# Patient Record
Sex: Female | Born: 1960 | Race: Black or African American | Hispanic: No | Smoking: Former smoker
Health system: Southern US, Community
[De-identification: ages and names within clinical notes are randomized; demographics above are authoritative.]

## PROBLEM LIST (undated history)

## (undated) HISTORY — PX: TUBAL LIGATION: SHX77

## (undated) HISTORY — PX: PARTIAL HYSTERECTOMY: SHX80

---

## 2019-07-18 ENCOUNTER — Ambulatory Visit: Payer: Self-pay | Attending: Internal Medicine

## 2019-07-18 DIAGNOSIS — Z23 Encounter for immunization: Secondary | ICD-10-CM

## 2019-07-18 NOTE — Progress Notes (Signed)
   Covid-19 Vaccination Clinic  Name:  Nicole Schultz    MRN: [id number] DOB: May 08, 1960  07/18/2019  [name] was observed post Covid-19 immunization for 15 minutes without incident. She was provided with Vaccine Information Sheet and instruction to access the V-Safe system.   [name] was instructed to call 911 with any severe reactions post vaccine: Marland Kitchen Difficulty breathing  . Swelling of face and throat  . A fast heartbeat  . A bad rash all over body  . Dizziness and weakness   Immunizations Administered    Name Date Dose VIS Date Route   Pfizer COVID-19 Vaccine 07/18/2019 10:26 AM 0.3 mL 04/10/2019 Intramuscular   Manufacturer: [employer], [employer]   Lot: [id number]   NDC: [address]-4

## 2019-08-11 ENCOUNTER — Ambulatory Visit: Payer: Self-pay | Attending: Internal Medicine

## 2019-08-11 DIAGNOSIS — Z23 Encounter for immunization: Secondary | ICD-10-CM

## 2019-08-11 NOTE — Progress Notes (Signed)
   Covid-19 Vaccination Clinic  Name:  Nicole Schultz    MRN: [id number] DOB: Sep 09, 1960  08/11/2019  Nicole Schultz was observed post Covid-19 immunization for 15 minutes without incident. She was provided with Vaccine Information Sheet and instruction to access the V-Safe system.   Nicole Schultz was instructed to call 911 with any severe reactions post vaccine: Marland Kitchen Difficulty breathing  . Swelling of face and throat  . A fast heartbeat  . A bad rash all over body  . Dizziness and weakness   Immunizations Administered    Name Date Dose VIS Date Route   Pfizer COVID-19 Vaccine 08/11/2019  1:17 PM 0.3 mL 04/10/2019 Intramuscular   Manufacturer: [employer], [employer]   Lot: [id number]   NDC: [address]-9

## 2019-12-29 ENCOUNTER — Ambulatory Visit
Admission: EM | Admit: 2019-12-29 | Discharge: 2019-12-29 | Disposition: A | Discharge status: Home / Self Care | Payer: BC Managed Care – PPO | Attending: Emergency Medicine | Admitting: Emergency Medicine

## 2019-12-29 ENCOUNTER — Other Ambulatory Visit: Payer: Self-pay

## 2019-12-29 DIAGNOSIS — Z20822 Contact with and (suspected) exposure to covid-19: Secondary | ICD-10-CM | POA: Diagnosis not present

## 2019-12-29 NOTE — Discharge Instructions (Signed)

## 2019-12-29 NOTE — ED Triage Notes (Signed)
Patient in today after having a covid exposure 1 week ago. Patient denies any symptoms. Patient here for covid testing only.

## 2019-12-30 LAB — SARS CORONAVIRUS 2 (TAT 6-24 HRS): SARS Coronavirus 2: NEGATIVE

## 2020-04-06 ENCOUNTER — Other Ambulatory Visit: Payer: Self-pay

## 2020-04-06 ENCOUNTER — Ambulatory Visit
Admission: EM | Admit: 2020-04-06 | Discharge: 2020-04-06 | Disposition: A | Discharge status: Home / Self Care | Payer: BC Managed Care – PPO | Attending: Family Medicine | Admitting: Family Medicine

## 2020-04-06 DIAGNOSIS — Z20822 Contact with and (suspected) exposure to covid-19: Secondary | ICD-10-CM | POA: Diagnosis present

## 2020-04-06 NOTE — ED Triage Notes (Signed)
Patient here for COVID testing. Positive exposure. No symptoms.

## 2020-04-07 LAB — SARS CORONAVIRUS 2 (TAT 6-24 HRS): SARS Coronavirus 2: NEGATIVE

## 2020-04-12 ENCOUNTER — Other Ambulatory Visit: Payer: Self-pay | Admitting: Neurology

## 2020-04-12 DIAGNOSIS — R2689 Other abnormalities of gait and mobility: Secondary | ICD-10-CM

## 2020-04-25 ENCOUNTER — Other Ambulatory Visit: Payer: BC Managed Care – PPO

## 2020-05-16 ENCOUNTER — Other Ambulatory Visit: Payer: BC Managed Care – PPO

## 2020-05-30 ENCOUNTER — Other Ambulatory Visit: Payer: BC Managed Care – PPO

## 2020-06-13 ENCOUNTER — Ambulatory Visit (INDEPENDENT_AMBULATORY_CARE_PROVIDER_SITE_OTHER): Payer: BC Managed Care – PPO

## 2020-06-13 ENCOUNTER — Other Ambulatory Visit: Payer: Self-pay

## 2020-06-13 DIAGNOSIS — R2689 Other abnormalities of gait and mobility: Secondary | ICD-10-CM

## 2020-06-13 DIAGNOSIS — M48061 Spinal stenosis, lumbar region without neurogenic claudication: Secondary | ICD-10-CM

## 2020-06-13 DIAGNOSIS — M4804 Spinal stenosis, thoracic region: Secondary | ICD-10-CM

## 2020-06-13 DIAGNOSIS — M4802 Spinal stenosis, cervical region: Secondary | ICD-10-CM | POA: Diagnosis not present

## 2020-06-13 MED ORDER — GADOBUTROL 1 MMOL/ML IV SOLN
10.0000 mL | Freq: Once | INTRAVENOUS | Status: AC | PRN
  Administered 2020-06-13: 10 mL via INTRAVENOUS

## 2021-09-18 IMAGING — MR MR THORACIC SPINE W/O CM
6 series · 37 of 48 positions shown · non-contrast
Comparison: None.

CLINICAL DATA: Imbalance for several years. Left hand and left leg
numbness. No known injury.

EXAM:
MRI CERVICAL, THORACIC AND LUMBAR SPINE WITHOUT CONTRAST
TECHNIQUE: Multiplanar and multiecho pulse sequences of the cervical spine, to
include the craniocervical junction and cervicothoracic junction,
and thoracic and lumbar spine, were obtained without intravenous
contrast.

[Series 15: T1 · sagittal · 7.0mm · 1.41mm/px · 2 of 7 slices shown (1 of 2)]
[im 1/7]
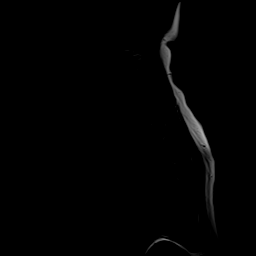
[im 7/7]
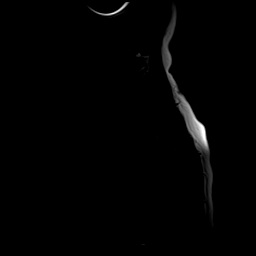

[Series 16: T2 · sagittal · 3.0mm · 1.00mm/px · 6 of 15 slices shown (1 of 2)]
[im 1/15]
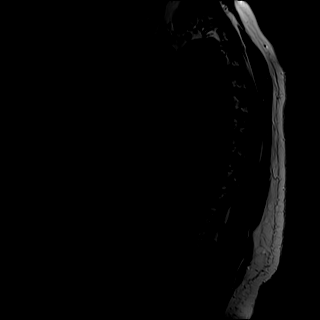
[im 3/15]
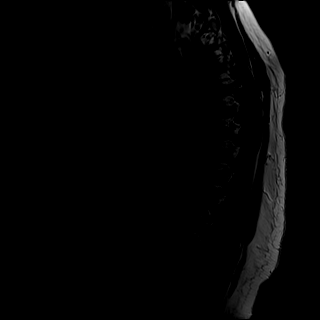
[im 6/15]
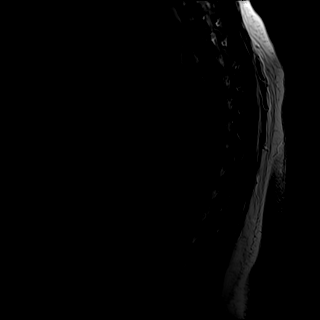
[im 9/15]
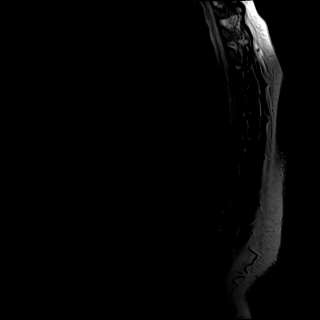
[im 12/15]
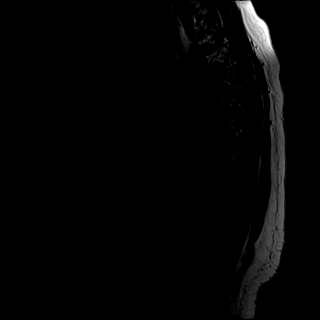
[im 15/15]
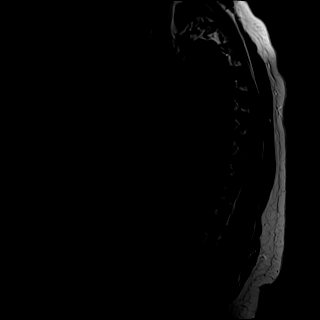

[Series 17: T1 · sagittal · 3.0mm · 1.00mm/px · 6 of 15 slices shown (2 of 2)]
[im 1/15]
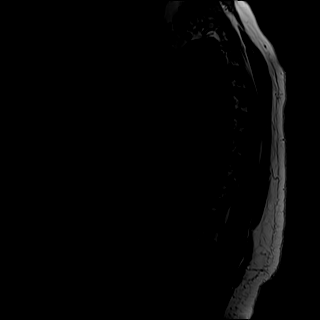
[im 3/15]
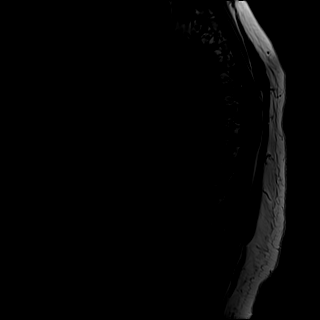
[im 6/15]
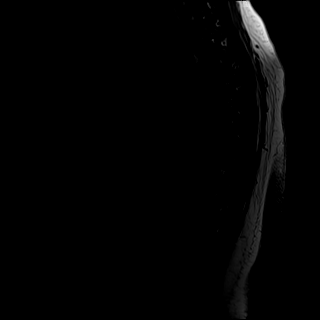
[im 9/15]
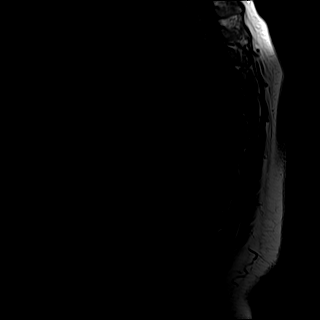
[im 12/15]
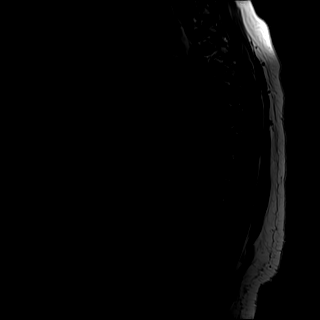
[im 15/15]
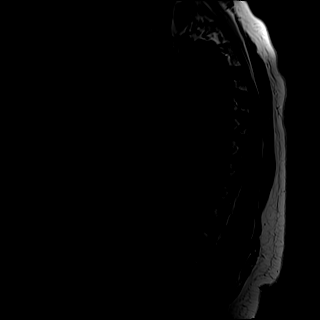

[Series 18: STIR · sagittal · 3.0mm · 1.00mm/px · 6 of 15 slices shown]
[im 1/15]
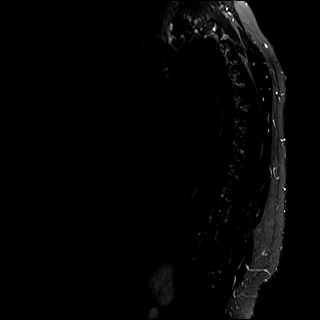
[im 3/15]
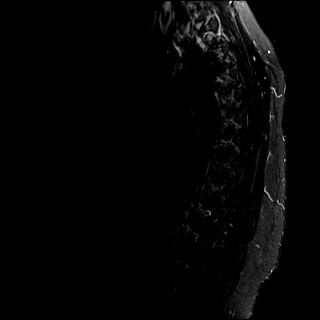
[im 6/15]
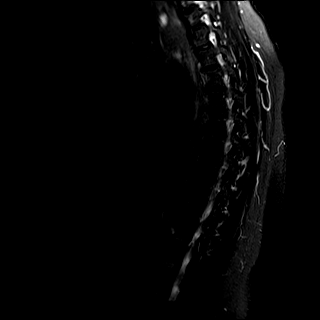
[im 9/15]
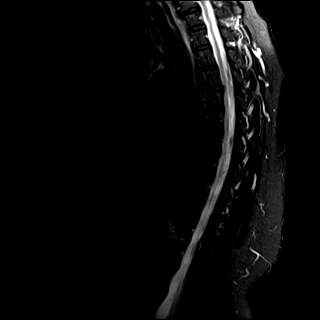
[im 12/15]
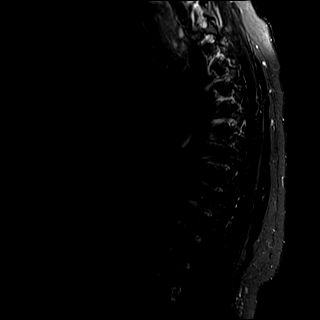
[im 15/15]
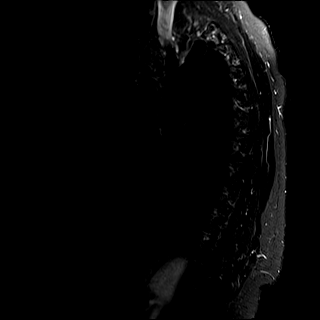

[Series 21: T2 · axial · 5.0mm · 0.86mm/px · z∈[-251,-39]mm · 9 of 39 slices shown (2 of 2)]
[im 1/39]
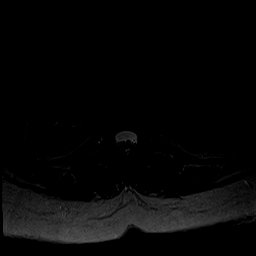
[im 3/39]
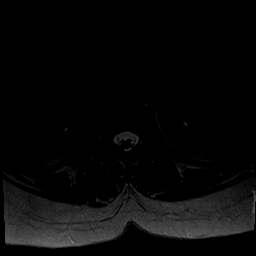
[im 6/39]
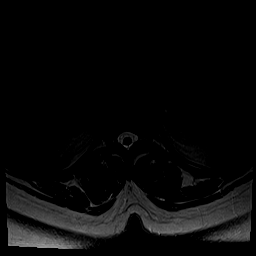
[im 12/39]
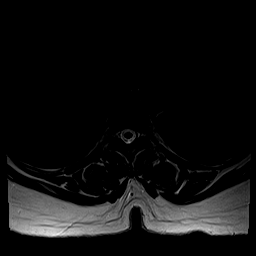
[im 18/39]
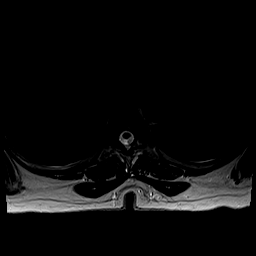
[im 21/39]
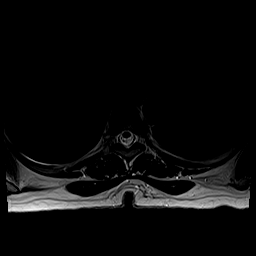
[im 27/39]
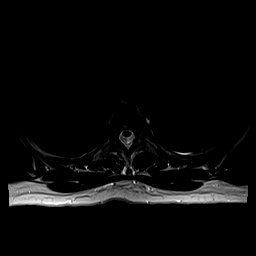
[im 33/39]
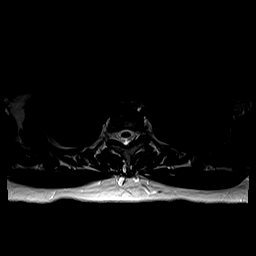
[im 39/39]
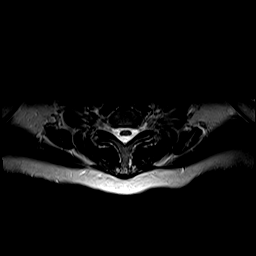

[Series 24: mpgr [id number]_11 · axial · 5.0mm · 0.35mm/px · z∈[-265,-27]mm · 8 of 39 slices shown]
[im 1/39]
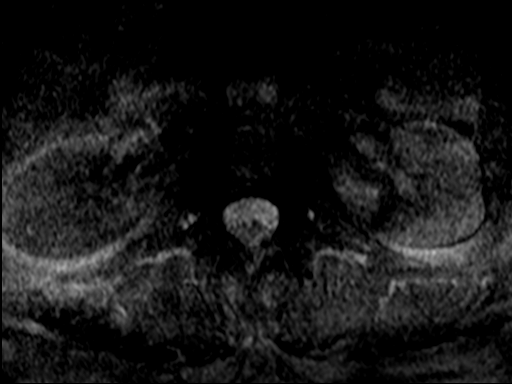
[im 6/39]
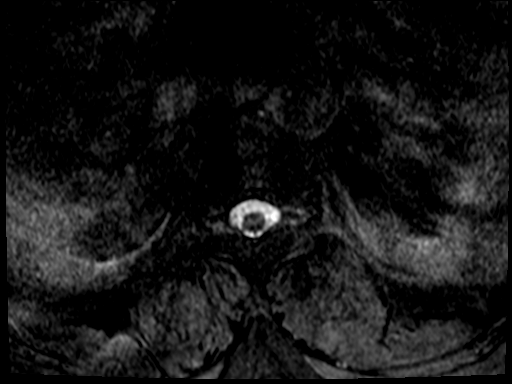
[im 12/39]
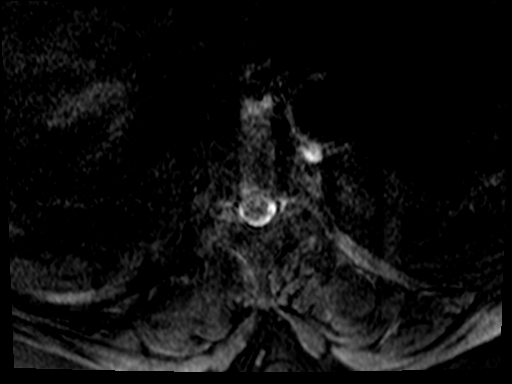
[im 18/39]
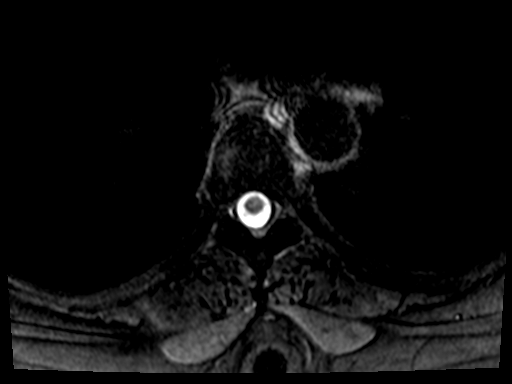
[im 21/39]
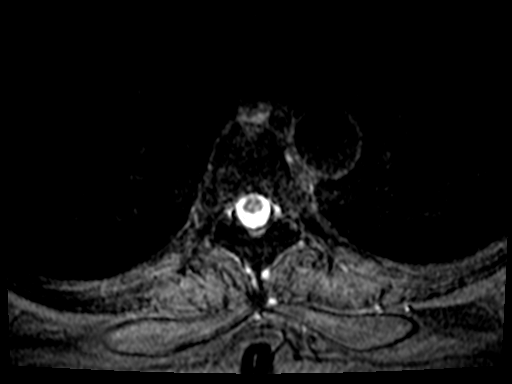
[im 27/39]
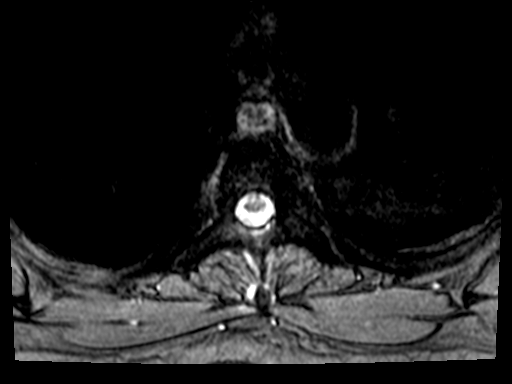
[im 33/39]
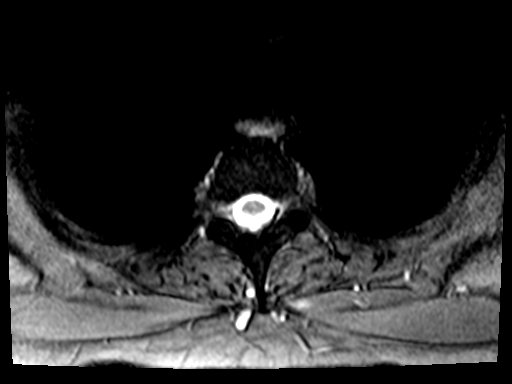
[im 39/39]
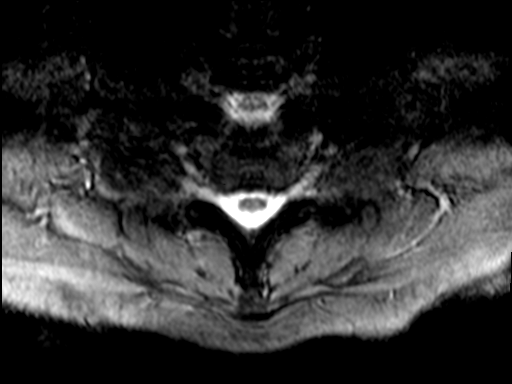

[37 of 48 positions shown; findings below may reference images not displayed]

FINDINGS: MRI CERVICAL SPINE FINDINGS

Alignment: Reversal of the normal cervical lordosis. No substantial
sagittal subluxation.

Vertebrae: Vertebral body heights are maintained. Degenerative
endplate signal changes about the C4-C5 disc without substantial
disc edema.

Cord: Normal cord signal.

Posterior Fossa, vertebral arteries, paraspinal tissues: Negative.

Disc levels:

C2-C3: No significant disc protrusion, foraminal stenosis, or canal
stenosis.

C3-C4: No significant disc protrusion, foraminal stenosis, or canal
stenosis.

C4-C5: Small posterior disc osteophyte complex with right greater
than left facet and uncovertebral hypertrophy. Mild right foraminal
stenosis. Disc contacts and mildly deforms the right eccentric cord
without significant canal stenosis.

C5-C6: Small posterior disc osteophyte complex and mild bilateral
uncovertebral hypertrophy without significant canal stenosis. Mild
right foraminal stenosis. Disc contacts and mildly flattens the
ventral cord without significant canal stenosis.

C6-C7: Small posterior disc osteophyte complex which mildly flattens
the ventral cord. Left greater than right uncovertebral hypertrophy
with mild left foraminal stenosis.

C7-T1: No significant disc protrusion, foraminal stenosis, or canal
stenosis.

MRI THORACIC SPINE FINDINGS

Alignment:  Normal.

Vertebrae: Vertebral body heights are maintained. Degenerative edema
involving the anterior/inferior T10 vertebral body.

Cord:  Normal cord signal and morphology.

Paraspinal and other soft tissues: Unremarkable.

Disc levels:

No significant canal stenosis. Degenerative changes result in mild
bilateral foraminal stenosis at T10-T11

MRI LUMBAR SPINE FINDINGS

Segmentation:  Standard.

Alignment:  No substantial sagittal subluxation.

Vertebrae: Vertebral body heights are maintained. Mild edema
involving the anterior/inferior L4 vertebral body.

Conus medullaris and cauda equina: Conus extends to the L1-L2 level.
Conus and cauda equina appear normal.

Paraspinal and other soft tissues: Bilateral extrarenal pelvis.

Disc levels:

T12-L1: No significant disc protrusion, foraminal stenosis, or canal
stenosis.

L1-L2: No significant disc protrusion, foraminal stenosis, or canal
stenosis.

L2-L3: No significant disc protrusion, foraminal stenosis, or canal
stenosis.

L3-L4: Disc desiccation. Broad-based disc bulge and mild bilateral
facet hypertrophy. Mild canal and bilateral foraminal stenosis

L4-L5: Disc desiccation. Mild broad disc bulge and mild bilateral
facet hypertrophy. Mild bilateral foraminal stenosis without
significant canal stenosis.

L5-S1: Mild disc bulging and mild bilateral facet hypertrophy
without significant canal or foraminal stenosis.
IMPRESSION: Cervical spine:

1. Focal degenerative endplate signal changes about the C4-C5 disc
with mild to moderate right foraminal stenosis at this level.
2. Otherwise, mild multilevel degenerative change with posterior
disc osteophyte complexes contacting and mildly flattening the
ventral cord at multiple levels without significant canal stenosis.
Mild foraminal stenosis on the right at C5-C6 and left at C6-C7.

Thoracic and lumbar spine:

1. Mild canal stenosis at L3-L4.
2. Mild foraminal stenosis bilaterally at T10-T11, L3-L4, and L4-L5.
3. Focal degenerative endplate signal changes involving the
anterior/inferior T10 and L4 vertebral bodies. While nonspecific and
potentially secondary to routine degenerative change, this finding
can be seen in the setting of early ankylosing spondylitis ( "shiny
corners"). Consider SI joint imaging to evaluate for sacroiliitis,
if clinically indicated.

## 2022-01-16 ENCOUNTER — Encounter: Payer: Self-pay | Admitting: Emergency Medicine

## 2022-01-16 ENCOUNTER — Ambulatory Visit
Admission: EM | Admit: 2022-01-16 | Discharge: 2022-01-16 | Disposition: A | Discharge status: Home / Self Care | Payer: BC Managed Care – PPO | Attending: Emergency Medicine | Admitting: Emergency Medicine

## 2022-01-16 DIAGNOSIS — J069 Acute upper respiratory infection, unspecified: Secondary | ICD-10-CM | POA: Diagnosis not present

## 2022-01-16 DIAGNOSIS — R059 Cough, unspecified: Secondary | ICD-10-CM | POA: Diagnosis present

## 2022-01-16 DIAGNOSIS — Z20822 Contact with and (suspected) exposure to covid-19: Secondary | ICD-10-CM | POA: Diagnosis not present

## 2022-01-16 DIAGNOSIS — J029 Acute pharyngitis, unspecified: Secondary | ICD-10-CM | POA: Insufficient documentation

## 2022-01-16 LAB — SARS CORONAVIRUS 2 BY RT PCR: SARS Coronavirus 2 by RT PCR: NEGATIVE

## 2022-01-16 NOTE — ED Triage Notes (Signed)
Pt presents with headache, runny nose, body aches since yesterday.

## 2022-01-16 NOTE — Discharge Instructions (Addendum)
Your COVID test today was negative.  I believe you have a viral respiratory infection.  Use over-the-counter Tylenol and ibuprofen according to the package instructions as needed for headache and body aches.  Use over-the-counter cough preparations such as Tylenol Cold and sinus, Advil Cold and Sinus, Robitussin, Delsym, or Zarbee's as needed for cough and congestion.  Return for reevaluation for any new or worsening symptoms.

## 2022-01-16 NOTE — ED Provider Notes (Signed)
[facility]    CSN: [id number] Arrival date & time: 01/16/22  [id number]      History   Chief Complaint Chief Complaint  Patient presents with   Headache   Nasal Congestion   Generalized Body Aches    HPI Nicole Schultz is a 61 y.o. female.   HPI  61 year old female here for evaluation of respiratory complaints.  Patient reports that her symptoms began yesterday and they consist of headache, body aches, runny nose, sore throat, and nonproductive cough.  She denies the presence of fever, ear pain, shortness breath or wheezing, or GI complaints.  She has no known sick contacts and she has not had any recent travel.  History reviewed. No pertinent past medical history.  There are no problems to display for this patient.   Past Surgical History:  Procedure Laterality Date   PARTIAL HYSTERECTOMY     TUBAL LIGATION      OB History   No obstetric history on file.      Home Medications    Prior to Admission medications   Medication Sig Start Date End Date Taking? Authorizing Provider  Black Pepper-Turmeric (TURMERIC COMPLEX/BLACK PEPPER) 3-500 MG CAPS  05/18/19  Yes [provider]  ascorbic acid (VITAMIN C) 100 MG tablet Take by mouth.    [provider]  Biotin 10 MG TABS Take by mouth.    [provider]  Cholecalciferol 125 MCG (5000 UT) TABS Take by mouth.    [provider]  cyanocobalamin (VITAMIN B12) 500 MCG tablet Take by mouth.    [provider]  loratadine-pseudoephedrine (CLARITIN-D 12-HOUR) 5-120 MG tablet Take by mouth.    [provider]  Multiple Vitamin (MULTIVITAMIN) tablet Take by mouth.    [provider]  valACYclovir (VALTREX) 1000 MG tablet TK 1 T PO ONCE DAILY    [provider]    Family History History reviewed. No pertinent family history.  Social History Social History   Tobacco Use   Smoking status: Former    Types: Cigarettes   Smokeless tobacco:  Never  Vaping Use   Vaping Use: Never used  Substance Use Topics   Alcohol use: Not Currently   Drug use: Never     Allergies   Penicillins   Review of Systems Review of Systems  Constitutional:  Negative for fever.  HENT:  Positive for congestion, rhinorrhea and sore throat. Negative for ear pain.   Respiratory:  Positive for cough. Negative for shortness of breath and wheezing.   Gastrointestinal:  Negative for diarrhea, nausea and vomiting.  Musculoskeletal:  Positive for arthralgias and myalgias.  Skin:  Negative for rash.  Neurological:  Positive for headaches.  Hematological: Negative.   Psychiatric/Behavioral: Negative.       Physical Exam Triage Vital Signs ED Triage Vitals  Enc Vitals Group     BP 01/16/22 0912 (!) 147/84     Pulse Rate 01/16/22 0912 70     Resp 01/16/22 0912 16     Temp 01/16/22 0912 98.3 F (36.8 C)     Temp Source 01/16/22 0912 Oral     SpO2 01/16/22 0912 100 %     Weight --      Height --      Head Circumference --      Peak Flow --      Pain Score 01/16/22 0905 4     Pain Loc --      Pain Edu? --  Excl. in GC? --    No data found.  Updated Vital Signs BP (!) 147/84 (BP Location: Left Arm)   Pulse 70   Temp 98.3 F (36.8 C) (Oral)   Resp 16   SpO2 100%   Visual Acuity Right Eye Distance:   Left Eye Distance:   Bilateral Distance:    Right Eye Near:   Left Eye Near:    Bilateral Near:     Physical Exam Vitals and nursing note reviewed.  Constitutional:      Appearance: Normal appearance. She is not ill-appearing.  HENT:     Head: Normocephalic and atraumatic.     Right Ear: Tympanic membrane, ear canal and external ear normal. There is no impacted cerumen.     Left Ear: Tympanic membrane, ear canal and external ear normal. There is no impacted cerumen.     Nose: Congestion and rhinorrhea present.     Mouth/Throat:     Mouth: Mucous membranes are moist.     Pharynx: Oropharynx is clear. Posterior  oropharyngeal erythema present. No oropharyngeal exudate.  Cardiovascular:     Rate and Rhythm: Normal rate and regular rhythm.     Pulses: Normal pulses.     Heart sounds: Normal heart sounds. No murmur heard.    No friction rub. No gallop.  Pulmonary:     Effort: Pulmonary effort is normal.     Breath sounds: Normal breath sounds. No wheezing, rhonchi or rales.  Musculoskeletal:     Cervical back: Normal range of motion and neck supple.  Lymphadenopathy:     Cervical: No cervical adenopathy.  Skin:    General: Skin is warm and dry.     Capillary Refill: Capillary refill takes less than 2 seconds.     Findings: No erythema or rash.  Neurological:     General: No focal deficit present.     Mental Status: She is alert and oriented to person, place, and time.  Psychiatric:        Mood and Affect: Mood normal.        Behavior: Behavior normal.        Thought Content: Thought content normal.        Judgment: Judgment normal.      UC Treatments / Results  Labs (all labs ordered are listed, but only abnormal results are displayed) Labs Reviewed  SARS CORONAVIRUS 2 BY RT PCR    EKG   Radiology No results found.  Procedures Procedures (including critical care time)  Medications Ordered in UC Medications - No data to display  Initial Impression / Assessment and Plan / UC Course  I have reviewed the triage vital signs and the nursing notes.  Pertinent labs & imaging results that were available during my care of the patient were reviewed by me and considered in my medical decision making (see chart for details).   Patient is a nontoxic-appearing 61 year old female here for evaluation of respiratory symptoms that been going on for the past day and consist of headache, body aches, runny nose, sore throat, and a nonproductive cough.  Patient is not coughing in the exam room and she is able to speak in full sentences without any dyspnea or tachypnea.  Her exam reveals  erythematous edematous nasal mucosa with clear discharge and posterior oropharyngeal erythema with clear postnasal drip.  No cervical lymphadenopathy appreciable exam.  Her lung sounds are clear to auscultation all fields.  Her exam findings are consistent with an upper respiratory infection.  I will order a COVID PCR.  Patient left prior to her test results.  I will have staff call her to let her know the results of her COVID test.  COVID PCR is negative.    I will discharge patient home with a diagnosis of viral URI with a cough.  She can continue using over-the-counter cold preparations to help with her symptoms.  Tylenol and ibuprofen as needed for body aches and headache.   Final Clinical Impressions(s) / UC Diagnoses   Final diagnoses:  Viral URI with cough     Discharge Instructions      Your COVID test today was negative.  I believe you have a viral respiratory infection.  Use over-the-counter Tylenol and ibuprofen according to the package instructions as needed for headache and body aches.  Use over-the-counter cough preparations such as Tylenol Cold and sinus, Advil Cold and Sinus, Robitussin, Delsym, or Zarbee's as needed for cough and congestion.  Return for reevaluation for any new or worsening symptoms.     ED Prescriptions   None    PDMP not reviewed this encounter.   Margarette Canada, NP 01/16/22 1009

## 2022-06-22 ENCOUNTER — Ambulatory Visit: Payer: BC Managed Care – PPO | Admitting: Internal Medicine
